# Patient Record
Sex: Female | Born: 1980 | Race: White | Hispanic: No | Marital: Single | Smoking: Never smoker
Health system: Southern US, Community
[De-identification: ages and names within clinical notes are randomized; demographics above are authoritative.]

## PROBLEM LIST (undated history)

## (undated) DIAGNOSIS — T7840XA Allergy, unspecified, initial encounter: Secondary | ICD-10-CM

## (undated) HISTORY — DX: Allergy, unspecified, initial encounter: T78.40XA

---
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
## 1999-11-02 ENCOUNTER — Other Ambulatory Visit: Admission: RE | Admit: 1999-11-02 | Discharge: 1999-11-02 | Payer: Self-pay | Admitting: Obstetrics and Gynecology

## 2000-11-08 ENCOUNTER — Other Ambulatory Visit: Admission: RE | Admit: 2000-11-08 | Discharge: 2000-11-08 | Payer: Self-pay | Admitting: Obstetrics and Gynecology

## 2001-11-17 ENCOUNTER — Other Ambulatory Visit: Admission: RE | Admit: 2001-11-17 | Discharge: 2001-11-17 | Payer: Self-pay | Admitting: Obstetrics and Gynecology

## 2012-02-23 ENCOUNTER — Ambulatory Visit (INDEPENDENT_AMBULATORY_CARE_PROVIDER_SITE_OTHER): Payer: BC Managed Care – PPO | Admitting: Family Medicine

## 2012-02-23 VITALS — BP 122/83 | HR 89 | Temp 98.8°F | Resp 16 | Ht 64.0 in | Wt 164.0 lb

## 2012-02-23 DIAGNOSIS — J029 Acute pharyngitis, unspecified: Secondary | ICD-10-CM

## 2012-02-23 DIAGNOSIS — J329 Chronic sinusitis, unspecified: Secondary | ICD-10-CM

## 2012-02-23 LAB — POCT RAPID STREP A (OFFICE): Rapid Strep A Screen: NEGATIVE

## 2012-02-23 MED ORDER — AZITHROMYCIN 250 MG PO TABS: ORAL_TABLET | ORAL | Status: DC

## 2012-02-23 NOTE — Progress Notes (Signed)
Subjective: Patient has a three-week history of having had a sinus infection. She's treated with a course of Augmentin. She did better until a week ago started having bad sore throat with hoarseness. She missed today's work last week. She continues on with a right-sided sore throat. No ear problems. Minimal nasal congestion and cough.  Objective: TMs normal. Sinuses nontender. Throat erythematous on the right. Strep screen, throat culture taken. Neck supple without significant nodes. Chest is clear to. Heart regular without murmurs.  Results for orders placed in visit on 02/23/12  POCT RAPID STREP A (OFFICE)      Component Value Range   Rapid Strep A Screen Negative  Negative   None strep pharyngitis  Plan: Z-Pak  If she doesn't improve we'll probably treated with tapered dose of prednisone.

## 2012-02-23 NOTE — Patient Instructions (Addendum)
Drink plenty of fluids  Try to rest her voice  Suck on lozenges, take ibuprofen or acetaminophen as needed for discomfort.  Return if not improving.   Take antibiotics as ordered

## 2012-02-25 LAB — CULTURE, GROUP A STREP: Organism ID, Bacteria: NORMAL

## 2012-03-18 ENCOUNTER — Ambulatory Visit (INDEPENDENT_AMBULATORY_CARE_PROVIDER_SITE_OTHER): Payer: BC Managed Care – PPO | Admitting: Physician Assistant

## 2012-03-18 VITALS — BP 111/79 | HR 78 | Temp 99.0°F | Resp 17 | Ht 67.0 in | Wt 161.0 lb

## 2012-03-18 DIAGNOSIS — R1310 Dysphagia, unspecified: Secondary | ICD-10-CM

## 2012-03-18 MED ORDER — OMEPRAZOLE 20 MG PO CPDR: 20.0000 mg | DELAYED_RELEASE_CAPSULE | Freq: Two times a day (BID) | ORAL | Status: DC

## 2012-03-18 NOTE — Progress Notes (Signed)
  Subjective:    Patient ID: Leah Nelson, female    DOB: 02-May-1980, 31 y.o.   MRN: [id number]  HPI 31 yr old CF presents with a sore throat without any other symptoms.  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  See previous notes.  She had 2 URIs and took antibiotics for those.  Around thanksgiving she started develop a sore throat when swallowing, no other time.  She is not having trouble swallowing food.  She has not lost weight.  She had mono many years ago.  She doesn't feel sick at all.  She hasn't had fevers. The pain when swallowing is deep in her neck.  Denies typical reflux or heartburn, but she has noticed the pain is worse at night.  Review of Systems  All other systems reviewed and are negative.       Objective:   Physical Exam  Nursing note and vitals reviewed. Constitutional: She is oriented to person, place, and time. She appears well-developed and well-nourished.  HENT:  Head: Normocephalic and atraumatic.  Mouth/Throat: No oropharyngeal exudate (throat with PND, no erythema.).  Neck: Normal range of motion. Neck supple. No thyromegaly present.       No occipital nodes. No supraclavicular nodes.  Cardiovascular: Normal rate, regular rhythm and normal heart sounds.   Pulmonary/Chest: Effort normal and breath sounds normal.  Lymphadenopathy:    She has no cervical adenopathy.  Neurological: She is alert and oriented to person, place, and time.  Skin: Skin is warm and dry.  Psychiatric: She has a normal mood and affect. Her behavior is normal.      Assessment & Plan:  Sore throat-unsure etiology.  Start omeprazole bid.  Will refer to ENT in 2-3 weeks.  If she is well, she can cancel that appointment.

## 2015-03-26 ENCOUNTER — Ambulatory Visit (INDEPENDENT_AMBULATORY_CARE_PROVIDER_SITE_OTHER): Payer: BC Managed Care – PPO | Admitting: Family Medicine

## 2015-03-26 VITALS — BP 122/82 | HR 95 | Temp 98.2°F | Resp 20 | Ht 67.5 in | Wt 167.8 lb

## 2015-03-26 DIAGNOSIS — H6692 Otitis media, unspecified, left ear: Secondary | ICD-10-CM

## 2015-03-26 DIAGNOSIS — H9202 Otalgia, left ear: Secondary | ICD-10-CM

## 2015-03-26 MED ORDER — CIPROFLOXACIN-DEXAMETHASONE 0.3-0.1 % OT SUSP: 4.0000 [drp] | Freq: Two times a day (BID) | OTIC | Status: DC

## 2015-03-26 NOTE — Patient Instructions (Signed)

## 2015-03-26 NOTE — Progress Notes (Signed)
Chief Complaint:  Chief Complaint  Patient presents with  . Ear Injury    left ear    HPI: Leah Nelson is a 34 y.o. female who reports to [facility] today complaining of left OM   Treated with amoxacillin, sharp pain improved, she has had this since Friday and went to [facility] where they gave her Amoxacillin, sinus are improved. She has no fevers or chills. SH ehas a feeling of being under water.   Past Medical History  Diagnosis Date  . Allergy    History reviewed. No pertinent past surgical history. Social History   Social History  . Marital Status: Single    Spouse Name: N/A  . Number of Children: N/A  . Years of Education: N/A   Social History Main Topics  . Smoking status: Never Smoker   . Smokeless tobacco: None  . Alcohol Use: 2.4 oz/week    4 Glasses of wine per week  . Drug Use: No  . Sexual Activity: Not Currently    Birth Control/ Protection: Abstinence   Other Topics Concern  . None   Social History Narrative   Family History  Problem Relation Age of Onset  . Cancer Mother   . Hyperlipidemia Father   . Hypertension Father   . Heart disease Maternal Grandmother   . Heart disease Paternal Grandfather    No Known Allergies Prior to Admission medications   Medication Sig Start Date End Date Taking? Authorizing Provider  amoxicillin (AMOXIL) 875 MG tablet Take 875 mg by mouth 2 (two) times daily.   Yes Historical Provider, MD  calcium carbonate (OS-CAL) 600 MG TABS Take 600 mg by mouth 2 (two) times daily with a meal. Takes 1200 mg qd   Yes Historical Provider, MD  medroxyPROGESTERone (DEPO-PROVERA) 150 MG/ML injection Inject 150 mg into the muscle every 3 (three) months.   Yes Historical Provider, MD  Multiple Vitamin (MULTIVITAMIN) tablet Take 1 tablet by mouth daily.   Yes Historical Provider, MD  azithromycin (ZITHROMAX) 250 MG tablet Take 2 initiallly, then one daily for 4 days Patient not taking: Reported on 03/26/2015 02/23/12   Peyton Najjar, MD  omeprazole (PRILOSEC) 20 MG capsule Take 1 capsule (20 mg total) by mouth 2 (two) times daily. Patient not taking: Reported on 03/26/2015 03/18/12   Anders Simmonds, PA-C     ROS: The patient denies fevers, chills, night sweats, unintentional weight loss, chest pain, palpitations, wheezing, dyspnea on exertion, nausea, vomiting, abdominal pain, dysuria, hematuria, melena, numbness, weakness, or tingling.   All other systems have been reviewed and were otherwise negative with the exception of those mentioned in the HPI and as above.    PHYSICAL EXAM: Filed Vitals:   03/26/15 1336  BP: 122/82  Pulse: 95  Temp: 98.2 F (36.8 C)  Resp: 20   Body mass index is 25.88 kg/(m^2).   General: Alert, no acute distress HEENT:  Normocephalic, atraumatic, oropharynx patent. EOMI, PERRLA LEft TM erytheamtous , dull , no fluid Cardiovascular:  Regular rate and rhythm, no rubs murmurs or gallops.   Skin: No rashes. Neurologic: Facial musculature symmetric. Psychiatric: Patient acts appropriately throughout our interaction. Lymphatic: No cervical or submandibular lymphadenopathy Musculoskeletal: Gait intact. No edema, tenderness Respiratory: Clear to auscultation bilaterally.  No wheezes, rales, or rhonchi.  No cyanosis, no use of accessory musculature Abdominal: No organomegaly, abdomen is soft and non-tender, positive bowel sounds.   LABS: Results for orders placed or performed in visit on  02/23/12  Culture, Group A Strep  Result Value Ref Range   Organism ID, Bacteria Normal Upper Respiratory Flora    Organism ID, Bacteria No Beta Hemolytic Streptococci Isolated   POCT rapid strep A  Result Value Ref Range   Rapid Strep A Screen Negative Negative     EKG/XRAY:   Primary read [facility]   ASSESSMENT/PLAN: Encounter Diagnoses  Name Primary?  . Acute left otitis media, recurrence not specified, unspecified otitis media type Yes  . Otalgia of left  ear    Cont with amoxacillin 875 since improving slowly Added ciprodex, if too expensive then let me know and I will change to neomycin HC IF no improvement in 48-72 hours then call me and we can switch to omnicef Fu prn    Gross sideeffects, risk and benefits, and alternatives of medications d/w patient. Patient is aware that all medications have potential sideeffects and we are unable to predict every sideeffect or drug-drug interaction that may occur.  Thao Le DO  03/26/2015 3:42 PM

## 2015-03-31 ENCOUNTER — Telehealth: Payer: Self-pay

## 2015-03-31 MED ORDER — CEFDINIR 300 MG PO CAPS: 300.0000 mg | ORAL_CAPSULE | Freq: Two times a day (BID) | ORAL | Status: DC

## 2015-03-31 NOTE — Telephone Encounter (Signed)
Patient was seen by DR Conley RollsLe on 03/26/15 for an ear infection. She was given ear drops and has not relief. She was already on Amoxicillin and just completed the 10 days of it. Per patient patient dr Conley Rollsle told her to call our office back if she wasn't any better. Patient is wanted to know if Dr Conley RollsLe will be able to call in another medication (antibiotic) or if she will have to RTC. Patient uses [facility] on [address] and her call back is [phone]

## 2015-03-31 NOTE — Telephone Encounter (Signed)
Can you please call her to let her know I sent  In Omnicef 300 mg twice a day for her to take for additional 7 days and she should continue with the ciprodex for a total of 10 days starting from day I prescribed it to her. Take meds with probiotics. Stop Amouxacillin.

## 2015-04-01 NOTE — Telephone Encounter (Signed)
Left message for pt to call back  °

## 2015-04-02 NOTE — Telephone Encounter (Signed)
Left message on VM.

## 2018-03-28 ENCOUNTER — Other Ambulatory Visit: Payer: Self-pay | Admitting: Family Medicine

## 2018-03-28 DIAGNOSIS — M79622 Pain in left upper arm: Secondary | ICD-10-CM

## 2018-03-29 HISTORY — PX: BREAST BIOPSY: SHX20

## 2018-04-06 ENCOUNTER — Ambulatory Visit
Admission: RE | Admit: 2018-04-06 | Discharge: 2018-04-06 | Disposition: A | Discharge status: Home / Self Care | Payer: BC Managed Care – PPO | Source: Ambulatory Visit | Attending: Family Medicine | Admitting: Family Medicine

## 2018-04-06 ENCOUNTER — Other Ambulatory Visit: Payer: Self-pay | Admitting: Family Medicine

## 2018-04-06 DIAGNOSIS — M79622 Pain in left upper arm: Secondary | ICD-10-CM

## 2018-04-06 DIAGNOSIS — N6320 Unspecified lump in the left breast, unspecified quadrant: Secondary | ICD-10-CM

## 2018-04-17 ENCOUNTER — Ambulatory Visit
Admission: RE | Admit: 2018-04-17 | Discharge: 2018-04-17 | Disposition: A | Discharge status: Home / Self Care | Payer: BC Managed Care – PPO | Source: Ambulatory Visit | Attending: Family Medicine | Admitting: Family Medicine

## 2018-04-17 DIAGNOSIS — N6320 Unspecified lump in the left breast, unspecified quadrant: Secondary | ICD-10-CM

## 2018-10-31 DIAGNOSIS — M545 Low back pain, unspecified: Secondary | ICD-10-CM | POA: Insufficient documentation

## 2018-12-13 ENCOUNTER — Ambulatory Visit: Payer: BC Managed Care – PPO | Admitting: Dietician

## 2019-09-06 ENCOUNTER — Other Ambulatory Visit: Payer: Self-pay | Admitting: Family Medicine

## 2019-09-06 DIAGNOSIS — Z1231 Encounter for screening mammogram for malignant neoplasm of breast: Secondary | ICD-10-CM

## 2019-09-17 ENCOUNTER — Other Ambulatory Visit: Payer: Self-pay

## 2019-09-17 ENCOUNTER — Ambulatory Visit
Admission: RE | Admit: 2019-09-17 | Discharge: 2019-09-17 | Disposition: A | Discharge status: Home / Self Care | Payer: BC Managed Care – PPO | Source: Ambulatory Visit | Attending: Family Medicine | Admitting: Family Medicine

## 2019-09-17 DIAGNOSIS — Z1231 Encounter for screening mammogram for malignant neoplasm of breast: Secondary | ICD-10-CM

## 2019-10-16 ENCOUNTER — Encounter: Payer: Self-pay | Admitting: Podiatry

## 2019-10-16 ENCOUNTER — Other Ambulatory Visit: Payer: Self-pay

## 2019-10-16 ENCOUNTER — Ambulatory Visit (INDEPENDENT_AMBULATORY_CARE_PROVIDER_SITE_OTHER): Payer: BC Managed Care – PPO

## 2019-10-16 ENCOUNTER — Ambulatory Visit: Payer: BC Managed Care – PPO | Admitting: Podiatry

## 2019-10-16 DIAGNOSIS — M722 Plantar fascial fibromatosis: Secondary | ICD-10-CM | POA: Diagnosis not present

## 2019-10-16 MED ORDER — MELOXICAM 15 MG PO TABS: 15.0000 mg | ORAL_TABLET | Freq: Every day | ORAL | 3 refills | Status: DC

## 2019-10-16 MED ORDER — METHYLPREDNISOLONE 4 MG PO TBPK: ORAL_TABLET | ORAL | 0 refills | Status: DC

## 2019-10-16 NOTE — Patient Instructions (Signed)

## 2019-10-17 NOTE — Progress Notes (Signed)
  Subjective:  Patient ID: Leah Nelson, female    DOB: [birth year],  MRN: [id number] HPI Chief Complaint  Patient presents with  . Foot Pain    Plantar heel bilateral - aching since April 2021, she's a Runner, broadcasting/film/video, some AM pain, tried [employer] store products, shopped on [employer] for [employer] (brought to show), some of the inserts bought has helped some.  . New Patient (Initial Visit)    39 y.o. female presents with the above complaint.   ROS: Denies fever chills nausea vomiting muscle aches pains calf pain back pain chest pain shortness of breath.  Past Medical History:  Diagnosis Date  . Allergy    Past Surgical History:  Procedure Laterality Date  . BREAST BIOPSY Left 03/2018   benign    Current Outpatient Medications:  .  meloxicam (MOBIC) 15 MG tablet, Take 1 tablet (15 mg total) by mouth daily., Disp: 30 tablet, Rfl: 3 .  methylPREDNISolone (MEDROL DOSEPAK) 4 MG TBPK tablet, 6 day dose pack - take as directed, Disp: 21 tablet, Rfl: 0  No Known Allergies Review of Systems Objective:  There were no vitals filed for this visit.  General: Well developed, nourished, in no acute distress, alert and oriented x3   Dermatological: Skin is warm, dry and supple bilateral. Nails x 10 are well maintained; remaining integument appears unremarkable at this time. There are no open sores, no preulcerative lesions, no rash or signs of infection present.  Vascular: Dorsalis Pedis artery and Posterior Tibial artery pedal pulses are 2/4 bilateral with immedate capillary fill time. Pedal hair growth present. No varicosities and no lower extremity edema present bilateral.   Neruologic: Grossly intact via light touch bilateral. Vibratory intact via tuning fork bilateral. Protective threshold with Semmes Wienstein monofilament intact to all pedal sites bilateral. Patellar and Achilles deep tendon reflexes 2+ bilateral. No Babinski or clonus noted bilateral.   Musculoskeletal: No gross boney  pedal deformities bilateral. No pain, crepitus, or limitation noted with foot and ankle range of motion bilateral. Muscular strength 5/5 in all groups tested bilateral.  Pain on palpation medial calcaneal tubercles bilaterally.  She has no pain on medial and lateral compression of the calcaneus to indicate fracture.  She has no tenderness laterally or posteriorly.  Gait: Unassisted, Nonantalgic.    Radiographs:  Radiographs taken today demonstrate a small plantar distally oriented calcaneal heel spur with soft tissue increase in density plantar fascial pain insertion sites bilaterally osseously mature individual otherwise with no acute findings.  Assessment & Plan:   Assessment: Plantar fasciitis bilateral x3 months.  Plan: Discussed etiology pathology conservative versus surgical therapies.  At this point I injected bilateral heels today 20 mg Kenalog 5 mg Marcaine.  Placed in a plantar fascial brace bilaterally and a single night splint.  Started her on methylprednisolone to be followed by meloxicam.  She will follow up with Raiford Noble for orthotics today.  Discussed appropriate shoe gear stretching exercises ice therapy and shoe gear modifications I will follow-up with her once his orthotics come in or in a month.     Jep Dyas T. [place], [place]

## 2019-11-08 ENCOUNTER — Other Ambulatory Visit: Payer: Self-pay

## 2019-11-08 ENCOUNTER — Ambulatory Visit: Payer: BC Managed Care – PPO | Admitting: Orthotics

## 2019-11-08 DIAGNOSIS — M722 Plantar fascial fibromatosis: Secondary | ICD-10-CM

## 2019-11-08 NOTE — Progress Notes (Signed)
Patient came in today to pick up custom made foot orthotics.  The goals were accomplished and the patient reported no dissatisfaction with said orthotics.  Patient was advised of breakin period and how to report any issues. 

## 2019-11-29 ENCOUNTER — Encounter: Payer: Self-pay | Admitting: Podiatry

## 2019-11-29 ENCOUNTER — Ambulatory Visit: Payer: BC Managed Care – PPO | Admitting: Podiatry

## 2019-11-29 ENCOUNTER — Other Ambulatory Visit: Payer: Self-pay

## 2019-11-29 DIAGNOSIS — M722 Plantar fascial fibromatosis: Secondary | ICD-10-CM

## 2019-11-29 NOTE — Progress Notes (Signed)
She presents today for follow-up of her bilateral heel pain states that her and so much better she says probably about 80 to 85% improved at this point.  She continues to use her plantar fascial brace night splint orthotics and meloxicam.  Objective: Vital signs are stable alert oriented x3 minimal pain on palpation medial calcaneal tubercles pulses are strong palpable bilateral.  Assessment: Plan fasciitis resolved 80 to 85% at this point.  Plan: Discussed etiology pathology conservative versus surgical therapies at this point went ahead and started her back on her meloxicam and she will continue all conservative therapies to she is 100% improved she will notify us with any regression.

## 2020-04-10 ENCOUNTER — Other Ambulatory Visit: Payer: Self-pay | Admitting: Podiatry

## 2020-10-03 ENCOUNTER — Other Ambulatory Visit: Payer: Self-pay | Admitting: Family Medicine

## 2020-10-03 DIAGNOSIS — Z1231 Encounter for screening mammogram for malignant neoplasm of breast: Secondary | ICD-10-CM

## 2020-10-11 ENCOUNTER — Other Ambulatory Visit: Payer: Self-pay

## 2020-10-11 ENCOUNTER — Ambulatory Visit
Admission: RE | Admit: 2020-10-11 | Discharge: 2020-10-11 | Disposition: A | Discharge status: Home / Self Care | Payer: BC Managed Care – PPO | Source: Ambulatory Visit

## 2020-10-11 DIAGNOSIS — Z1231 Encounter for screening mammogram for malignant neoplasm of breast: Secondary | ICD-10-CM

## 2020-10-13 ENCOUNTER — Other Ambulatory Visit: Payer: Self-pay | Admitting: Podiatry

## 2021-09-14 ENCOUNTER — Other Ambulatory Visit: Payer: Self-pay | Admitting: Family Medicine

## 2021-09-14 DIAGNOSIS — Z1231 Encounter for screening mammogram for malignant neoplasm of breast: Secondary | ICD-10-CM

## 2021-10-12 ENCOUNTER — Ambulatory Visit
Admission: RE | Admit: 2021-10-12 | Discharge: 2021-10-12 | Disposition: A | Discharge status: Home / Self Care | Payer: BC Managed Care – PPO | Source: Ambulatory Visit | Attending: Family Medicine | Admitting: Family Medicine

## 2021-10-12 DIAGNOSIS — Z1231 Encounter for screening mammogram for malignant neoplasm of breast: Secondary | ICD-10-CM

## 2021-10-14 ENCOUNTER — Other Ambulatory Visit: Payer: Self-pay | Admitting: Family Medicine

## 2021-10-14 DIAGNOSIS — R928 Other abnormal and inconclusive findings on diagnostic imaging of breast: Secondary | ICD-10-CM

## 2021-10-27 ENCOUNTER — Ambulatory Visit
Admission: RE | Admit: 2021-10-27 | Discharge: 2021-10-27 | Disposition: A | Discharge status: Home / Self Care | Payer: BC Managed Care – PPO | Source: Ambulatory Visit | Attending: Family Medicine | Admitting: Family Medicine

## 2021-10-27 ENCOUNTER — Ambulatory Visit: Payer: BC Managed Care – PPO

## 2021-10-27 DIAGNOSIS — R928 Other abnormal and inconclusive findings on diagnostic imaging of breast: Secondary | ICD-10-CM

## 2022-09-16 ENCOUNTER — Other Ambulatory Visit: Payer: Self-pay | Admitting: Family Medicine

## 2022-09-16 DIAGNOSIS — Z1231 Encounter for screening mammogram for malignant neoplasm of breast: Secondary | ICD-10-CM

## 2022-11-01 ENCOUNTER — Ambulatory Visit
Admission: RE | Admit: 2022-11-01 | Discharge: 2022-11-01 | Disposition: A | Discharge status: Home / Self Care | Payer: BC Managed Care – PPO | Source: Ambulatory Visit | Attending: Family Medicine | Admitting: Family Medicine

## 2022-11-01 DIAGNOSIS — Z1231 Encounter for screening mammogram for malignant neoplasm of breast: Secondary | ICD-10-CM

## 2022-11-28 IMAGING — MG MM DIGITAL SCREENING BILAT W/ TOMO AND CAD
8 series · 8 of 24 positions shown · non-contrast
Comparison: Previous exam(s).

CLINICAL DATA: Screening.

EXAM:
DIGITAL SCREENING BILATERAL MAMMOGRAM WITH TOMOSYNTHESIS AND CAD
TECHNIQUE: Bilateral screening digital craniocaudal and mediolateral oblique
mammograms were obtained. Bilateral screening digital breast
tomosynthesis was performed. The images were evaluated with
computer-aided detection.

[L CC synth-2D]
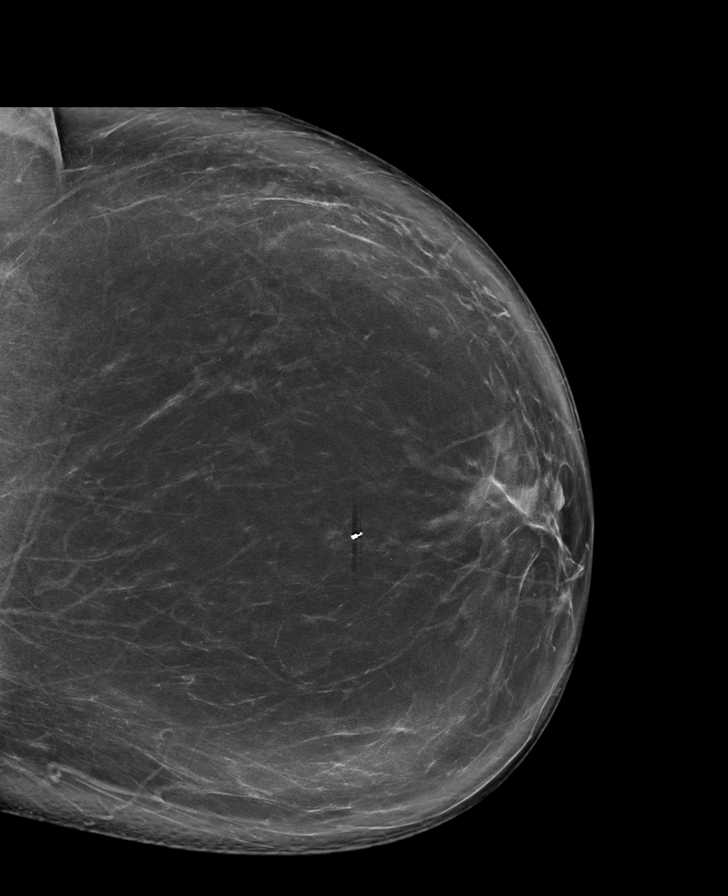

[L MLO synth-2D]
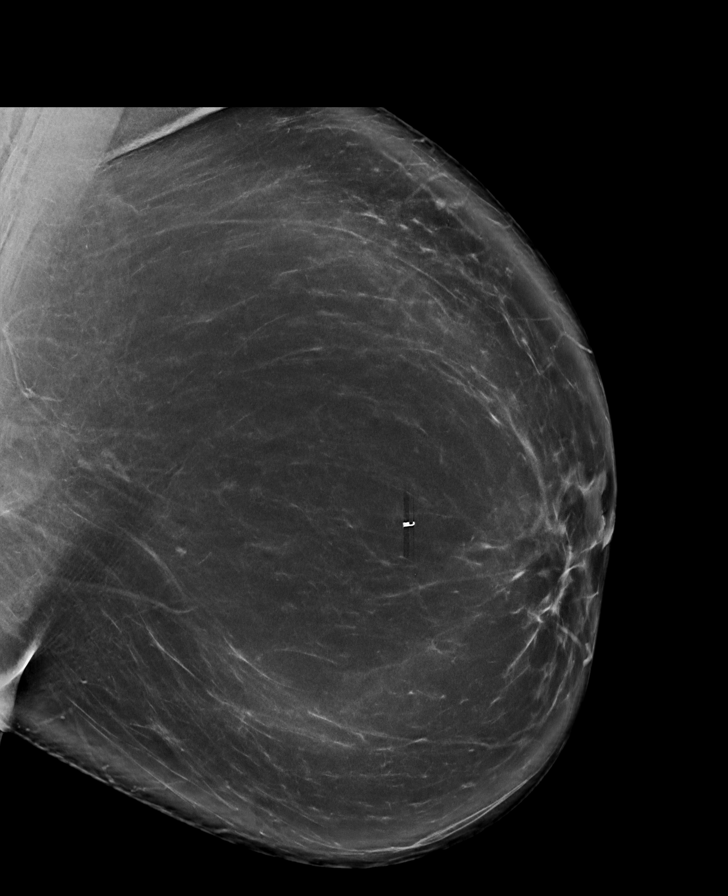

[R CC synth-2D]
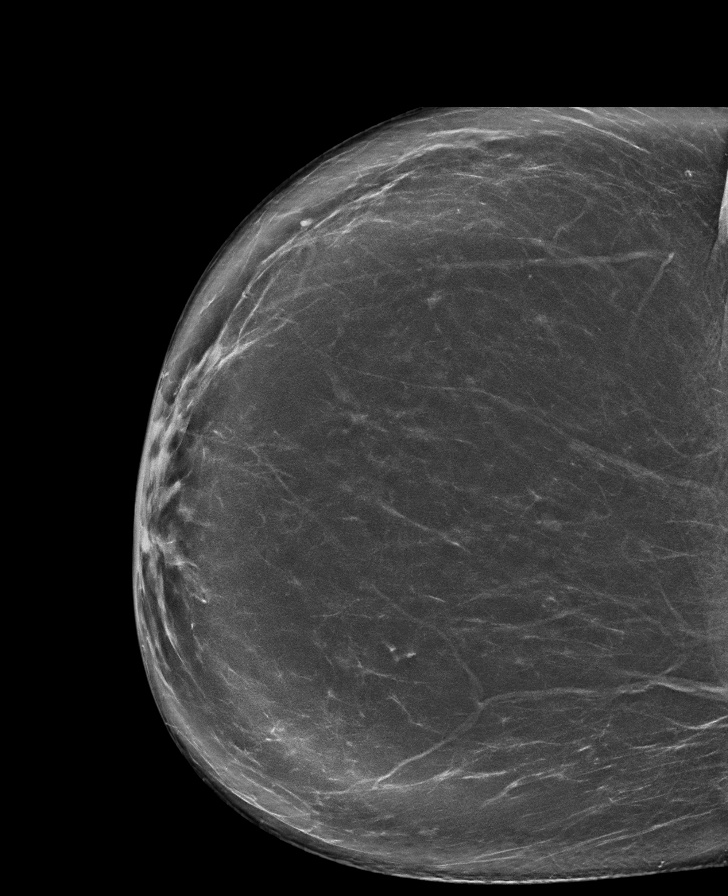

[R MLO synth-2D]
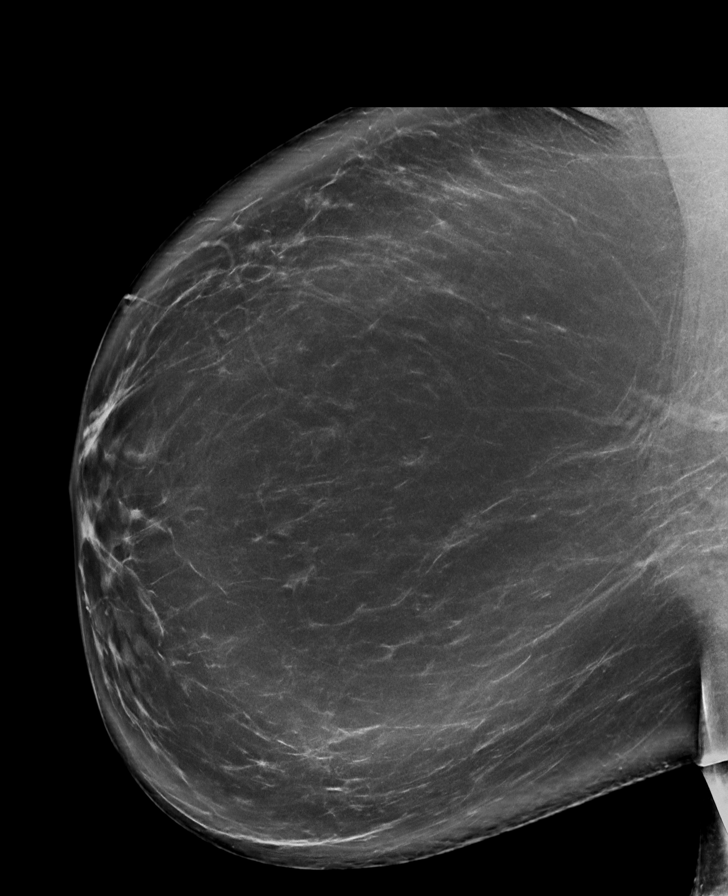

[L MLO tomo · tomo slice 59/117.0]
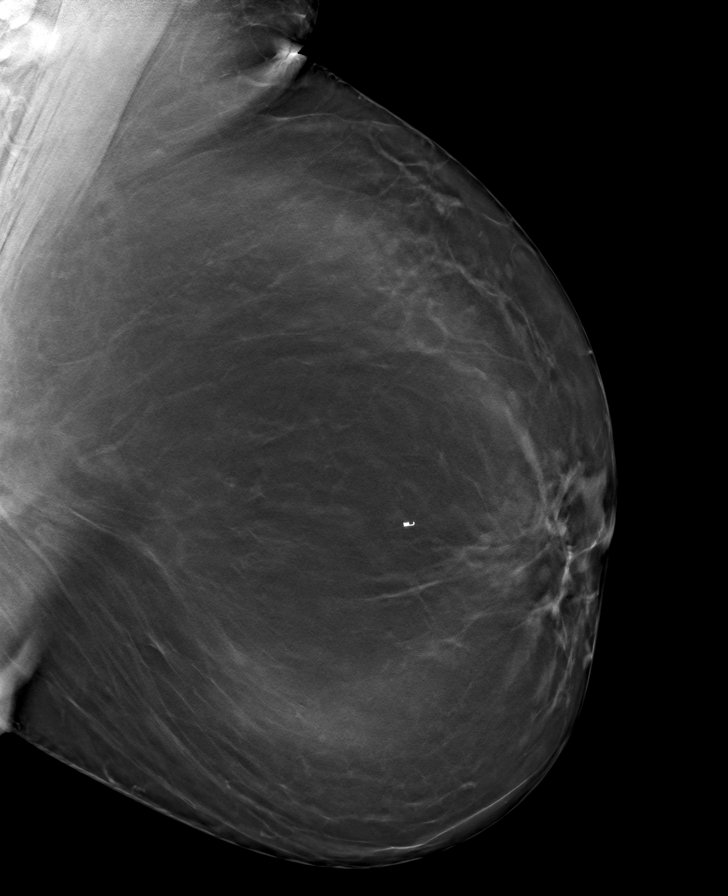

[R MLO tomo · tomo slice 59/117.0]
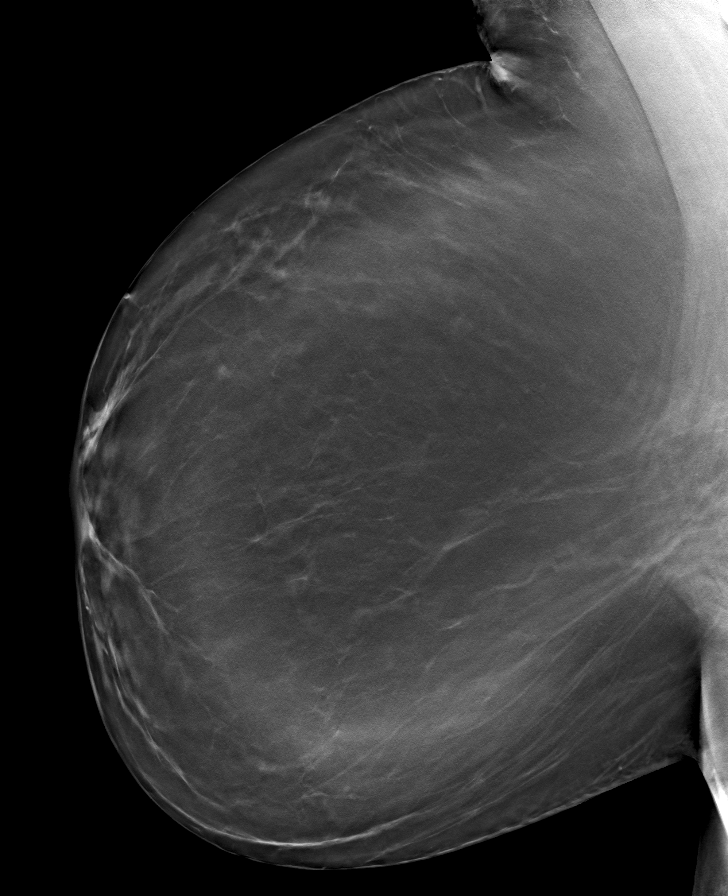

[R CC tomo · tomo slice 58/115.0]
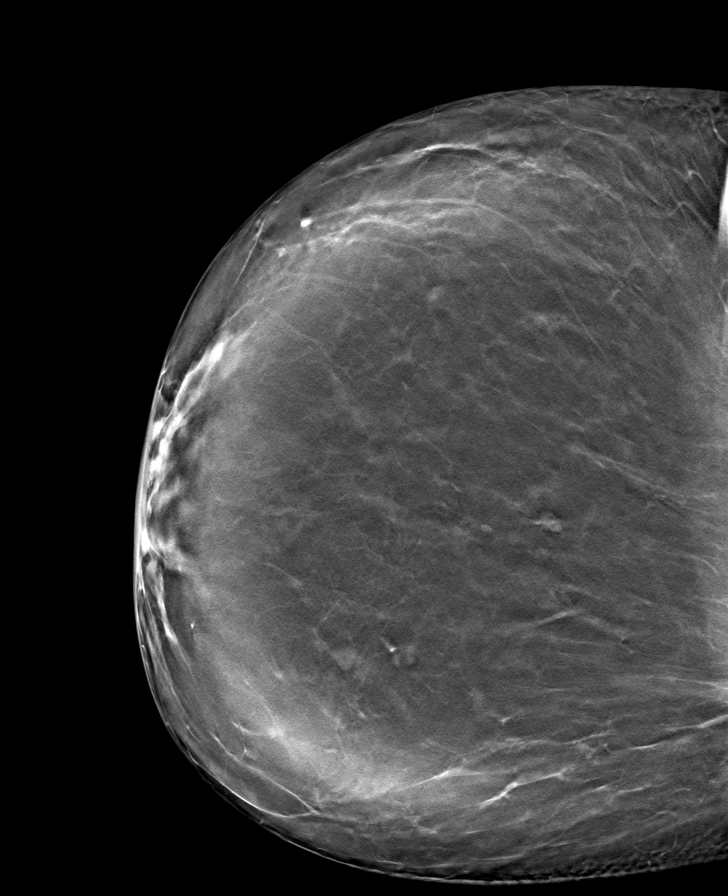

[L CC tomo · tomo slice 56/111.0]
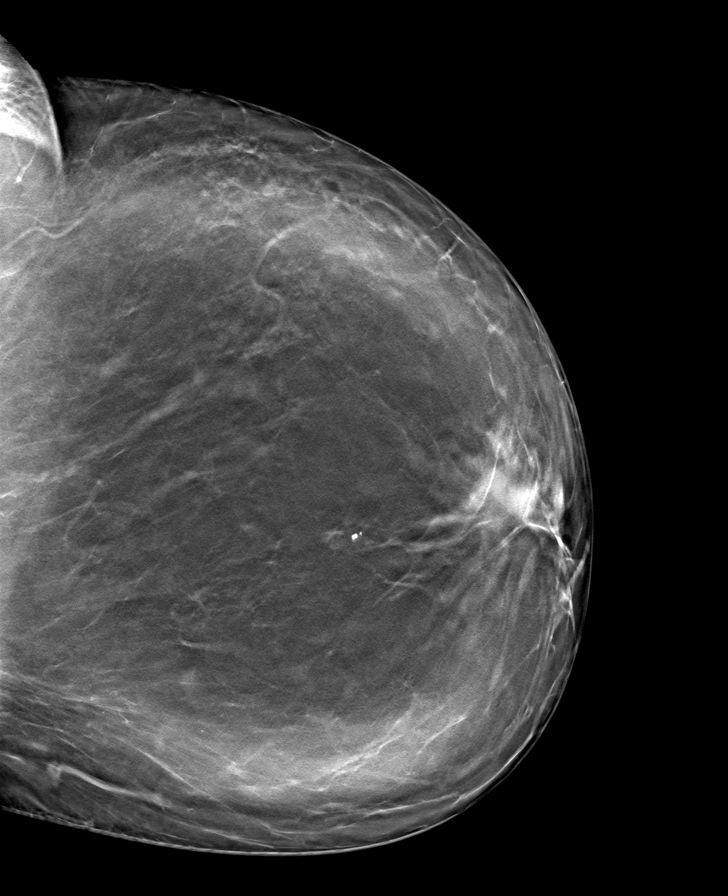

[8 of 24 positions shown; findings below may reference images not displayed]

ACR Breast Density Category b: There are scattered areas of
fibroglandular density.
FINDINGS: There are no findings suspicious for malignancy.
IMPRESSION: No mammographic evidence of malignancy. A result letter of this
screening mammogram will be mailed directly to the patient.

RECOMMENDATION:
Screening mammogram in one year. (Code:51-O-LD2)

BI-RADS CATEGORY  1: Negative.

## 2023-02-15 ENCOUNTER — Ambulatory Visit: Payer: BC Managed Care – PPO | Admitting: Podiatry

## 2023-03-03 ENCOUNTER — Ambulatory Visit (INDEPENDENT_AMBULATORY_CARE_PROVIDER_SITE_OTHER): Payer: BC Managed Care – PPO | Admitting: Podiatry

## 2023-03-03 ENCOUNTER — Encounter: Payer: Self-pay | Admitting: Podiatry

## 2023-03-03 DIAGNOSIS — M722 Plantar fascial fibromatosis: Secondary | ICD-10-CM | POA: Diagnosis not present

## 2023-03-03 MED ORDER — TRIAMCINOLONE ACETONIDE 40 MG/ML IJ SUSP
20.0000 mg | Freq: Once | INTRAMUSCULAR | Status: AC
  Administered 2023-03-03: 20 mg

## 2023-03-06 NOTE — Progress Notes (Signed)
She presents today after having not seen her for 2 to 3 years with a chief concern of pain to her left heel.  States the last time we saw her she was 85% improved but states that I think the orthotics might be wearing out so I have become tender again.  States that she continues to wear good shoes at all times.  Objective: Vital signs are stable alert oriented x 3.  Pulses are palpable.  Still has pain to palpation medial calcaneal tubercle of the left heel but not of the right.  Assessment: Recurrence of plantar fasciitis left.  Plan: Discussed etiology pathology conservative therapies at this point I reinjected her left heel 20 mg Kenalog 5 mg Marcaine point maximal tenderness.  Tolerated procedure well without complications.  She was given both oral written home-going instructions for the care of this also will provide her with a date for [facility] to get her some new orthotics made.

## 2023-03-31 ENCOUNTER — Ambulatory Visit: Payer: 59

## 2023-03-31 NOTE — Progress Notes (Signed)
  Patient was present and evaluated for Custom molded foot orthotics. Patient will benefit from CFO's to provide total contact to BIL MLA's helping to balance and distribute body weight more evenly across BIL feet helping to reduce plantar pressure and pain. Orthotic will also encourage FF / RF alignment  Patient was scanned today and will return for fitting upon receipt

## 2023-04-21 ENCOUNTER — Telehealth: Payer: Self-pay

## 2023-04-21 NOTE — Telephone Encounter (Signed)
Called pt to schedule orthotic pickup

## 2023-05-17 ENCOUNTER — Telehealth: Payer: Self-pay

## 2023-05-17 NOTE — Telephone Encounter (Signed)
 Left pt Vm to reschedule due to weather

## 2023-05-18 ENCOUNTER — Telehealth: Payer: Self-pay

## 2023-05-18 ENCOUNTER — Other Ambulatory Visit: Payer: 59

## 2023-05-18 NOTE — Telephone Encounter (Signed)
 LEFT PT VM TO SCHEDULE ORTHOTIC PICK UP

## 2023-06-02 ENCOUNTER — Ambulatory Visit (INDEPENDENT_AMBULATORY_CARE_PROVIDER_SITE_OTHER): Payer: 59

## 2023-06-02 DIAGNOSIS — M722 Plantar fascial fibromatosis: Secondary | ICD-10-CM | POA: Diagnosis not present

## 2023-06-02 DIAGNOSIS — M2142 Flat foot [pes planus] (acquired), left foot: Secondary | ICD-10-CM

## 2023-06-05 NOTE — Progress Notes (Signed)
 Patient presents today to pick up custom molded foot orthotics, diagnosed with PF by Dr. Al Corpus .   Orthotics were dispensed and fit was satisfactory. Reviewed instructions for break-in and wear. Written instructions given to patient.  Patient will follow up as needed.   Addison Bailey CPed, CFo, CFm

## 2023-10-03 ENCOUNTER — Other Ambulatory Visit: Payer: Self-pay | Admitting: Family Medicine

## 2023-10-03 DIAGNOSIS — Z1231 Encounter for screening mammogram for malignant neoplasm of breast: Secondary | ICD-10-CM

## 2023-11-02 ENCOUNTER — Inpatient Hospital Stay
Admission: RE | Admit: 2023-11-02 | Discharge: 2023-11-02 | Discharge status: Home / Self Care | Payer: Self-pay | Source: Ambulatory Visit | Attending: Family Medicine | Admitting: Family Medicine

## 2023-11-02 DIAGNOSIS — Z1231 Encounter for screening mammogram for malignant neoplasm of breast: Secondary | ICD-10-CM
